# Patient Record
Sex: Male | Born: 1985 | Race: Black or African American | Hispanic: No | Marital: Single | Smoking: Current every day smoker
Health system: Southern US, Community
[De-identification: ages and names within clinical notes are randomized; demographics above are authoritative.]

---
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
## 2004-01-18 ENCOUNTER — Emergency Department (HOSPITAL_COMMUNITY): Admission: EM | Admit: 2004-01-18 | Discharge: 2004-01-18 | Payer: Self-pay | Admitting: Emergency Medicine

## 2007-03-05 ENCOUNTER — Ambulatory Visit (HOSPITAL_COMMUNITY): Admission: RE | Admit: 2007-03-05 | Discharge: 2007-03-05 | Payer: Self-pay | Admitting: Emergency Medicine

## 2007-03-05 ENCOUNTER — Emergency Department (HOSPITAL_COMMUNITY): Admission: EM | Admit: 2007-03-05 | Discharge: 2007-03-05 | Payer: Self-pay | Admitting: Emergency Medicine

## 2008-06-26 ENCOUNTER — Emergency Department (HOSPITAL_COMMUNITY): Admission: EM | Admit: 2008-06-26 | Discharge: 2008-06-26 | Payer: Self-pay | Admitting: Emergency Medicine

## 2010-01-15 ENCOUNTER — Emergency Department (HOSPITAL_COMMUNITY): Admission: EM | Admit: 2010-01-15 | Discharge: 2010-01-15 | Payer: Self-pay | Admitting: Emergency Medicine

## 2011-07-17 ENCOUNTER — Encounter (HOSPITAL_COMMUNITY): Payer: Self-pay | Admitting: *Deleted

## 2011-07-17 ENCOUNTER — Emergency Department (INDEPENDENT_AMBULATORY_CARE_PROVIDER_SITE_OTHER)
Admission: EM | Admit: 2011-07-17 | Discharge: 2011-07-17 | Disposition: A | Discharge status: Home / Self Care | Payer: Self-pay | Source: Home / Self Care

## 2011-07-17 DIAGNOSIS — J302 Other seasonal allergic rhinitis: Secondary | ICD-10-CM

## 2011-07-17 DIAGNOSIS — J309 Allergic rhinitis, unspecified: Secondary | ICD-10-CM

## 2011-07-17 DIAGNOSIS — K047 Periapical abscess without sinus: Secondary | ICD-10-CM

## 2011-07-17 MED ORDER — AMOXICILLIN 500 MG PO CAPS: 500.0000 mg | ORAL_CAPSULE | Freq: Three times a day (TID) | ORAL | Status: AC

## 2011-07-17 MED ORDER — HYDROCODONE-ACETAMINOPHEN 5-500 MG PO TABS: 1.0000 | ORAL_TABLET | Freq: Four times a day (QID) | ORAL | Status: AC | PRN

## 2011-07-17 MED ORDER — CETIRIZINE HCL 10 MG PO TABS: 10.0000 mg | ORAL_TABLET | Freq: Every day | ORAL | Status: AC

## 2011-07-17 NOTE — ED Provider Notes (Signed)
Medical screening examination/treatment/procedure(s) were performed by resident physician or non-physician practitioner and as supervising physician I was immediately available for consultation/collaboration.   Braylei Totino DOUGLAS MD.    Peg Fifer D Cornelio Parkerson, MD 07/17/11 2059 

## 2011-07-17 NOTE — ED Provider Notes (Signed)
Anthony Burnett is a 26 y.o. male who presents to Urgent Care today for   1) stuffy nose and sneezing for 3 days. Has tried Sudafed and Tylenol which helps only a little. Also notes some watery eyes and headache. He denies any  trouble breathing  2) tooth pain present for months worsened recently. Pain in the back lower right. Has a remnant of a tooth in bed in his jaw. Has been to a dentist and given a  Quote of $4-500 for extraction. No fevers or chills.   PMH reviewed. Otherwise healthy young man works at [employer] ROS as above otherwise neg.  no chest pains, palpitations, fevers, chills, abdominal pain nausea or vomiting. Medications reviewed. No current facility-administered medications for this encounter.   Current Outpatient Prescriptions  Medication Sig Dispense Refill  . amoxicillin (AMOXIL) 500 MG capsule Take 1 capsule (500 mg total) by mouth 3 (three) times daily.  42 capsule  0  . cetirizine (ZYRTEC ALLERGY) 10 MG tablet Take 1 tablet (10 mg total) by mouth daily.  30 tablet  1  . HYDROcodone-acetaminophen (VICODIN) 5-500 MG per tablet Take 1 tablet by mouth every 6 (six) hours as needed for pain.  30 tablet  0    Exam:  BP 139/82  Pulse 68  Temp(Src) 98.4 F (36.9 C) (Oral)  Resp 18  SpO2 98% Gen: Well NAD HEENT: EOMI,  MMM. Lower right jaw with hypertrophied gingiva growing over a exposed route of a tooth.  Mildly fluctuant to probe.  Tender to touch. Nasal turbinates are boggy and with clear nasal discharge.  No tenderness to percussion over the sinuses. Lungs: CTABL Nl WOB Heart: RRR no MRG Exts: Non edematous BL  LE, warm and well perfused.   Procedure note: Attempted to drain dental abscess.  Consent obtained and timeout performed.  An 18-gauge needle was used to poke the hypertrophied tissue around the tooth remnant.  Mild bleeding but no pus. Procedure terminated.  Mild bleeding which stopped after less than one minute.  Assessment and Plan: 26 y.o. male with    1) seasonal allergies: Plan for Zyrtec and followup as needed.   2) dental abscess.  Not able to be drained by myself and the clinic today.  Ultimately he will need a dental extraction, which is more complicated as it is just the tooth root that is left.  Will treat pain and abscess with amoxicillin and Vicodin.  Encouraged him to followup with a dentist as soon as possible.  He expresses understanding.      Rodolph Bong, MD 07/17/11 [phone]

## 2011-07-17 NOTE — Discharge Instructions (Signed)
Thank you for coming in today. That tooth needs to come out.  Please see a dentist as soon as possible. Take the pain medicine as needed. Take antibiotics 3 times a day for up to 2 weeks. I think you also have seasonal allergies. Take the Zyrtec daily.  Abscessed Tooth A tooth abscess is a collection of infected fluid (pus) from a bacterial infection in the inner part of the tooth (pulp). It usually occurs at the end of the tooth's root.  CAUSES   A very bad cavity (extensive tooth decay).   Trauma to the tooth, such as a broken or chipped tooth, that allows bacteria to enter into the pulp.  SYMPTOMS  Severe pain in and around the infected tooth.   Swelling and redness around the abscessed tooth or in the mouth or face.   Tenderness.   Pus drainage.   Bad breath.   Bitter taste in the mouth.   Difficulty swallowing.   Difficulty opening the mouth.   Feeling sick to your stomach (nauseous).   Vomiting.   Chills.   Swollen neck glands.  DIAGNOSIS  A medical and dental history will be taken.   An examination will be performed by tapping on the abscessed tooth.   X-rays may be taken of the tooth to identify the abscess.  TREATMENT The goal of treatment is to eliminate the infection.   You may be prescribed antibiotic medicine to stop the infection from spreading.   A root canal may be performed to save the tooth. If the tooth cannot be saved, it may be pulled (extracted) and the abscess may be drained.  HOME CARE INSTRUCTIONS  Only take over-the-counter or prescription medicines for pain, fever, or discomfort as directed by your caregiver.   Do not drive after taking pain medicine (narcotics).   Rinse your mouth (gargle) often with salt water ( tsp salt in 8 oz of warm water) to relieve pain or swelling.   Do not apply heat to the outside of your face.   Return to your dentist for further treatment as directed.  SEEK IMMEDIATE DENTAL CARE IF:  You have a  temperature by mouth above 102 F (38.9 C), not controlled by medicine.   You have chills or a very bad headache.   You have problems breathing or swallowing.   Your have trouble opening your mouth.   You develop swelling in the neck or around the eye.   Your pain is not helped by medicine.   Your pain is getting worse instead of better.  Document Released: 04/01/2005 Document Revised: 03/21/2011 Document Reviewed: 07/10/2010 [facility] Patient Information 2012 [place], [place].  Allergies, Generic Allergies may happen from anything your body is sensitive to. This may be food, medicines, pollens, chemicals, and nearly anything around you in everyday life that produces allergens. An allergen is anything that causes an allergy producing substance. Heredity is often a factor in causing these problems. This means you may have some of the same allergies as your parents. Food allergies happen in all age groups. Food allergies are some of the most severe and life threatening. Some common food allergies are cow's milk, seafood, eggs, nuts, wheat, and soybeans. SYMPTOMS   Swelling around the mouth.   An itchy red rash or hives.   Vomiting or diarrhea.   Difficulty breathing.  SEVERE ALLERGIC REACTIONS ARE LIFE-THREATENING. This reaction is called anaphylaxis. It can cause the mouth and throat to swell and cause difficulty with breathing and swallowing. In  severe reactions only a trace amount of food (for example, peanut oil in a salad) may cause death within seconds. Seasonal allergies occur in all age groups. These are seasonal because they usually occur during the same season every year. They may be a reaction to molds, grass pollens, or tree pollens. Other causes of problems are house dust mite allergens, pet dander, and mold spores. The symptoms often consist of nasal congestion, a runny itchy nose associated with sneezing, and tearing itchy eyes. There is often an associated itching of the  mouth and ears. The problems happen when you come in contact with pollens and other allergens. Allergens are the particles in the air that the body reacts to with an allergic reaction. This causes you to release allergic antibodies. Through a chain of events, these eventually cause you to release histamine into the blood stream. Although it is meant to be protective to the body, it is this release that causes your discomfort. This is why you were given anti-histamines to feel better. If you are unable to pinpoint the offending allergen, it may be determined by skin or blood testing. Allergies cannot be cured but can be controlled with medicine. Hay fever is a collection of all or some of the seasonal allergy problems. It may often be treated with simple over-the-counter medicine such as diphenhydramine. Take medicine as directed. Do not drink alcohol or drive while taking this medicine. Check with your caregiver or package insert for child dosages. If these medicines are not effective, there are many new medicines your caregiver can prescribe. Stronger medicine such as nasal spray, eye drops, and corticosteroids may be used if the first things you try do not work well. Other treatments such as immunotherapy or desensitizing injections can be used if all else fails. Follow up with your caregiver if problems continue. These seasonal allergies are usually not life threatening. They are generally more of a nuisance that can often be handled using medicine. HOME CARE INSTRUCTIONS   If unsure what causes a reaction, keep a diary of foods eaten and symptoms that follow. Avoid foods that cause reactions.   If hives or rash are present:   Take medicine as directed.   You may use an over-the-counter antihistamine (diphenhydramine) for hives and itching as needed.   Apply cold compresses (cloths) to the skin or take baths in cool water. Avoid hot baths or showers. Heat will make a rash and itching worse.   If  you are severely allergic:   Following a treatment for a severe reaction, hospitalization is often required for closer follow-up.   Wear a medic-alert bracelet or necklace stating the allergy.   You and your family must learn how to give adrenaline or use an anaphylaxis kit.   If you have had a severe reaction, always carry your anaphylaxis kit or EpiPen with you. Use this medicine as directed by your caregiver if a severe reaction is occurring. Failure to do so could have a fatal outcome.  SEEK MEDICAL CARE IF:  You suspect a food allergy. Symptoms generally happen within 30 minutes of eating a food.   Your symptoms have not gone away within 2 days or are getting worse.   You develop new symptoms.   You want to retest yourself or your child with a food or drink you think causes an allergic reaction. Never do this if an anaphylactic reaction to that food or drink has happened before. Only do this under the care of a caregiver.  SEEK IMMEDIATE MEDICAL CARE IF:   You have difficulty breathing, are wheezing, or have a tight feeling in your chest or throat.   You have a swollen mouth, or you have hives, swelling, or itching all over your body.   You have had a severe reaction that has responded to your anaphylaxis kit or an EpiPen. These reactions may return when the medicine has worn off. These reactions should be considered life threatening.  MAKE SURE YOU:   Understand these instructions.   Will watch your condition.   Will get help right away if you are not doing well or get worse.  Document Released: 06/25/2002 Document Revised: 03/21/2011 Document Reviewed: 11/30/2007 [facility] Patient Information 2012 [place], [place].

## 2011-07-17 NOTE — ED Notes (Signed)
Pt  Reports  Symptoms  Of     Sinus  Congestion     Headache  Stuffy  Nose  And       Cough          X  1  Week            He   Is     Awake    And  Alert

## 2013-02-09 ENCOUNTER — Emergency Department (INDEPENDENT_AMBULATORY_CARE_PROVIDER_SITE_OTHER)
Admission: EM | Admit: 2013-02-09 | Discharge: 2013-02-09 | Disposition: A | Discharge status: Home / Self Care | Payer: Self-pay | Source: Home / Self Care

## 2013-02-09 ENCOUNTER — Encounter (HOSPITAL_COMMUNITY): Payer: Self-pay | Admitting: Emergency Medicine

## 2013-02-09 DIAGNOSIS — K029 Dental caries, unspecified: Secondary | ICD-10-CM

## 2013-02-09 MED ORDER — CLINDAMYCIN HCL 300 MG PO CAPS: 300.0000 mg | ORAL_CAPSULE | Freq: Three times a day (TID) | ORAL | Status: DC

## 2013-02-09 MED ORDER — TRAMADOL HCL 50 MG PO TABS: 50.0000 mg | ORAL_TABLET | Freq: Four times a day (QID) | ORAL | Status: DC | PRN

## 2013-02-09 NOTE — ED Provider Notes (Signed)
CSN: [id number]     Arrival date & time 02/09/13  1424 History   None    Chief Complaint  Patient presents with  . Dental Pain   (Consider location/radiation/quality/duration/timing/severity/associated sxs/prior Treatment) Patient is a 27 y.o. male presenting with tooth pain. The history is provided by the patient.  Dental Pain Location:  Lower Lower teeth location:  32/RL 3rd molar and 31/RL 2nd molar Quality:  Throbbing Severity:  Moderate Onset quality:  Sudden Duration:  1 day Progression:  Worsening Chronicity:  New Context: abscess and poor dentition   Associated symptoms: no facial pain, no facial swelling, no fever, no neck pain and no trismus   Risk factors: periodontal disease   Risk factors: sufficient dental care     History reviewed. No pertinent past medical history. History reviewed. No pertinent past surgical history. History reviewed. No pertinent family history. History  Substance Use Topics  . Smoking status: Current Every Day Smoker  . Smokeless tobacco: Not on file  . Alcohol Use: Yes    Review of Systems  Constitutional: Negative for fever.  HENT: Positive for dental problem. Negative for facial swelling.   Musculoskeletal: Negative for neck pain.    Allergies  Review of patient's allergies indicates no known allergies.  Home Medications   Current Outpatient Rx  Name  Route  Sig  Dispense  Refill  . Acetaminophen (TYLENOL PO)   Oral   Take by mouth.         . EXPIRED: cetirizine (ZYRTEC ALLERGY) 10 MG tablet   Oral   Take 1 tablet (10 mg total) by mouth daily.   30 tablet   1   . clindamycin (CLEOCIN) 300 MG capsule   Oral   Take 1 capsule (300 mg total) by mouth 3 (three) times daily.   28 capsule   0   . traMADol (ULTRAM) 50 MG tablet   Oral   Take 1 tablet (50 mg total) by mouth every 6 (six) hours as needed for pain.   15 tablet   0    BP 105/66  Pulse 71  Temp(Src) 98 F (36.7 C) (Oral)  Resp 22  SpO2  100% Physical Exam  Nursing note and vitals reviewed. Constitutional: He is oriented to person, place, and time. He appears well-developed and well-nourished.  HENT:  Head: Normocephalic.  Right Ear: External ear normal.  Left Ear: External ear normal.  Mouth/Throat: Oropharynx is clear and moist. Abnormal dentition. Dental abscesses and dental caries present.    Neck: Normal range of motion. Neck supple.  Lymphadenopathy:    He has no cervical adenopathy.  Neurological: He is alert and oriented to person, place, and time.  Skin: Skin is warm and dry.    ED Course  Procedures (including critical care time) Labs Review Labs Reviewed - No data to display Imaging Review No results found.    MDM      Linna Hoff, MD 02/09/13 [phone]

## 2013-02-09 NOTE — ED Notes (Signed)
Pt assessed for c/o SOB.  Pt reports oral abscess (proximal to rt lower molar) that is draining and causing pn in rt face, and disturbing vision in rt eye. Vitals obtained (WNL), and pt asked to be seated in waiting area pending triage by RN.

## 2013-02-09 NOTE — ED Notes (Signed)
Pt  Has  Toothache     r  Lower  Side       Of  Mouth      -          He    Reports  Headache  As  Well  -  Pt  Reports         That   He  Spoke  To  A  Dentist  Today  And  Was  Advised  To  [facility]  [facility] and  Be  Given a  referell

## 2014-07-27 ENCOUNTER — Emergency Department (INDEPENDENT_AMBULATORY_CARE_PROVIDER_SITE_OTHER)
Admission: EM | Admit: 2014-07-27 | Discharge: 2014-07-27 | Disposition: A | Discharge status: Home / Self Care | Payer: Self-pay | Source: Home / Self Care | Attending: Emergency Medicine | Admitting: Emergency Medicine

## 2014-07-27 ENCOUNTER — Encounter (HOSPITAL_COMMUNITY): Payer: Self-pay

## 2014-07-27 DIAGNOSIS — J301 Allergic rhinitis due to pollen: Secondary | ICD-10-CM

## 2014-07-27 DIAGNOSIS — J309 Allergic rhinitis, unspecified: Secondary | ICD-10-CM

## 2014-07-27 MED ORDER — TRIAMCINOLONE ACETONIDE 40 MG/ML IJ SUSP
60.0000 mg | Freq: Once | INTRAMUSCULAR | Status: AC
  Administered 2014-07-27: 60 mg via INTRAMUSCULAR

## 2014-07-27 MED ORDER — TRIAMCINOLONE ACETONIDE 40 MG/ML IJ SUSP
INTRAMUSCULAR | Status: AC
  Filled 2014-07-27: qty 1

## 2014-07-27 MED ORDER — PREDNISONE 20 MG PO TABS: ORAL_TABLET | ORAL | Status: DC

## 2014-07-27 NOTE — Discharge Instructions (Signed)

## 2014-07-27 NOTE — ED Provider Notes (Signed)
CSN: [id number]     Arrival date & time 07/27/14  [id number] History   First MD Initiated Contact with Patient 07/27/14 [phone]     Chief Complaint  Patient presents with  . Facial Pain   (Consider location/radiation/quality/duration/timing/severity/associated sxs/prior Treatment) HPI Comments: 29 year old male complaining of congestion in the face and for head for [place] one month. It is associated with runny nose followed by dry nose frequently, soreness in the 4 head. He has been using Advil, nasal saline and Flonase. He denies PND Denies fevers.   History reviewed. No pertinent past medical history. History reviewed. No pertinent past surgical history. History reviewed. No pertinent family history. History  Substance Use Topics  . Smoking status: Current Every Day Smoker  . Smokeless tobacco: Not on file  . Alcohol Use: Yes    Review of Systems  Constitutional: Negative for fever, activity change and fatigue.  HENT: Positive for congestion, rhinorrhea and sinus pressure. Negative for ear pain, postnasal drip and sore throat.   Eyes: Negative.   Respiratory: Negative for cough and shortness of breath.   Cardiovascular: Negative.   Gastrointestinal: Negative.     Allergies  Review of patient's allergies indicates no known allergies.  Home Medications   Prior to Admission medications   Medication Sig Start Date End Date Taking? Authorizing Provider  Acetaminophen (TYLENOL PO) Take by mouth.    Historical Provider, MD  cetirizine (ZYRTEC ALLERGY) 10 MG tablet Take 1 tablet (10 mg total) by mouth daily. 07/17/11 07/16/12  Rodolph BongEvan S Corey, MD  predniSONE (DELTASONE) 20 MG tablet Take 3 tabs po on first day, 2 tabs second day, 2 tabs third day, 1 tab fourth day, 1 tab 5th day. Take with food. Start 07/27/2014. 07/27/14   Hayden Rasmussenavid Loren Sawaya, NP   BP 116/68 mmHg  Pulse 67  Temp(Src) 98 F (36.7 C) (Oral)  Resp 12  SpO2 99% Physical Exam  Constitutional: He is oriented to person, place, and time. He  appears well-developed and well-nourished. No distress.  HENT:  Mouth/Throat: No oropharyngeal exudate.  Bilateral TMs are normal Oropharynx with minor erythema, moderate clear PND. Some maxillary and 4 head tenderness.  Eyes: Conjunctivae and EOM are normal.  Neck: Normal range of motion. Neck supple.  Cardiovascular: Normal rate, regular rhythm and normal heart sounds.   Pulmonary/Chest: Effort normal and breath sounds normal. No respiratory distress. He has no wheezes. He has no rales.  Lymphadenopathy:    He has no cervical adenopathy.  Neurological: He is alert and oriented to person, place, and time.  Skin: Skin is warm and dry.  Psychiatric: He has a normal mood and affect.  Nursing note and vitals reviewed.   ED Course  Procedures (including critical care time) Labs Review Labs Reviewed - No data to display  Imaging Review No results found.   MDM   1. Allergic rhinitis due to pollen   2. Allergic sinusitis    Use your choice of Zyrtec, Claritin or Allegra for drainage and allergies. Sudafed PE 10 mg every 4 hours for congestion Frequently use your saline nasal spray and netty pot as needed  Kenalog 60 mg IM Tomorrow start an is on taper Drink plenty of fluids stay well hydrated and follow above instructions.    Hayden Rasmussenavid Jodeen Mclin, NP 07/27/14 [phone]

## 2014-07-27 NOTE — ED Notes (Signed)
C/o pain in forehead, cheeks x 1 month. Yellow nasal secretions

## 2014-12-06 ENCOUNTER — Emergency Department (HOSPITAL_COMMUNITY)
Admission: EM | Admit: 2014-12-06 | Discharge: 2014-12-06 | Disposition: A | Discharge status: Home / Self Care | Payer: No Typology Code available for payment source | Attending: Emergency Medicine | Admitting: Emergency Medicine

## 2014-12-06 ENCOUNTER — Encounter (HOSPITAL_COMMUNITY): Payer: Self-pay | Admitting: Nurse Practitioner

## 2014-12-06 DIAGNOSIS — Z720 Tobacco use: Secondary | ICD-10-CM | POA: Insufficient documentation

## 2014-12-06 DIAGNOSIS — Z7952 Long term (current) use of systemic steroids: Secondary | ICD-10-CM | POA: Diagnosis not present

## 2014-12-06 DIAGNOSIS — Z79899 Other long term (current) drug therapy: Secondary | ICD-10-CM | POA: Insufficient documentation

## 2014-12-06 DIAGNOSIS — S4991XA Unspecified injury of right shoulder and upper arm, initial encounter: Secondary | ICD-10-CM | POA: Insufficient documentation

## 2014-12-06 DIAGNOSIS — S80212A Abrasion, left knee, initial encounter: Secondary | ICD-10-CM | POA: Insufficient documentation

## 2014-12-06 DIAGNOSIS — M25562 Pain in left knee: Secondary | ICD-10-CM

## 2014-12-06 DIAGNOSIS — Y9389 Activity, other specified: Secondary | ICD-10-CM | POA: Diagnosis not present

## 2014-12-06 DIAGNOSIS — Y999 Unspecified external cause status: Secondary | ICD-10-CM | POA: Diagnosis not present

## 2014-12-06 DIAGNOSIS — M7918 Myalgia, other site: Secondary | ICD-10-CM

## 2014-12-06 DIAGNOSIS — S8992XA Unspecified injury of left lower leg, initial encounter: Secondary | ICD-10-CM | POA: Diagnosis present

## 2014-12-06 DIAGNOSIS — Y92410 Unspecified street and highway as the place of occurrence of the external cause: Secondary | ICD-10-CM | POA: Insufficient documentation

## 2014-12-06 MED ORDER — ACETAMINOPHEN 325 MG PO TABS
650.0000 mg | ORAL_TABLET | Freq: Once | ORAL | Status: AC
  Administered 2014-12-06: 650 mg via ORAL
  Filled 2014-12-06: qty 2

## 2014-12-06 MED ORDER — NAPROXEN 250 MG PO TABS: 250.0000 mg | ORAL_TABLET | Freq: Two times a day (BID) | ORAL | Status: DC

## 2014-12-06 MED ORDER — METHOCARBAMOL 500 MG PO TABS: 500.0000 mg | ORAL_TABLET | Freq: Two times a day (BID) | ORAL | Status: DC | PRN
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
## 2014-12-07 ENCOUNTER — Emergency Department (HOSPITAL_COMMUNITY): Payer: No Typology Code available for payment source

## 2014-12-07 ENCOUNTER — Encounter (HOSPITAL_COMMUNITY): Payer: Self-pay | Admitting: *Deleted

## 2014-12-07 ENCOUNTER — Emergency Department (HOSPITAL_COMMUNITY)
Admission: EM | Admit: 2014-12-07 | Discharge: 2014-12-07 | Disposition: A | Discharge status: Home / Self Care | Payer: No Typology Code available for payment source | Attending: Emergency Medicine | Admitting: Emergency Medicine

## 2014-12-07 DIAGNOSIS — Y92410 Unspecified street and highway as the place of occurrence of the external cause: Secondary | ICD-10-CM | POA: Insufficient documentation

## 2014-12-07 DIAGNOSIS — S199XXA Unspecified injury of neck, initial encounter: Secondary | ICD-10-CM | POA: Insufficient documentation

## 2014-12-07 DIAGNOSIS — Z79899 Other long term (current) drug therapy: Secondary | ICD-10-CM | POA: Diagnosis not present

## 2014-12-07 DIAGNOSIS — Z720 Tobacco use: Secondary | ICD-10-CM | POA: Diagnosis not present

## 2014-12-07 DIAGNOSIS — Y998 Other external cause status: Secondary | ICD-10-CM | POA: Diagnosis not present

## 2014-12-07 DIAGNOSIS — S161XXA Strain of muscle, fascia and tendon at neck level, initial encounter: Secondary | ICD-10-CM

## 2014-12-07 DIAGNOSIS — S39012A Strain of muscle, fascia and tendon of lower back, initial encounter: Secondary | ICD-10-CM

## 2014-12-07 DIAGNOSIS — H43393 Other vitreous opacities, bilateral: Secondary | ICD-10-CM | POA: Diagnosis not present

## 2014-12-07 DIAGNOSIS — Y9389 Activity, other specified: Secondary | ICD-10-CM | POA: Diagnosis not present

## 2014-12-07 DIAGNOSIS — S80212A Abrasion, left knee, initial encounter: Secondary | ICD-10-CM | POA: Insufficient documentation

## 2014-12-07 DIAGNOSIS — S80211A Abrasion, right knee, initial encounter: Secondary | ICD-10-CM | POA: Diagnosis not present

## 2014-12-07 DIAGNOSIS — M542 Cervicalgia: Secondary | ICD-10-CM

## 2014-12-07 MED ORDER — METHOCARBAMOL 500 MG PO TABS
500.0000 mg | ORAL_TABLET | Freq: Once | ORAL | Status: AC
Start: 2014-12-07 — End: 2014-12-07
  Administered 2014-12-07: 500 mg via ORAL
  Filled 2014-12-07: qty 1

## 2014-12-07 MED ORDER — NAPROXEN 250 MG PO TABS
500.0000 mg | ORAL_TABLET | Freq: Once | ORAL | Status: AC
Start: 2014-12-07 — End: 2014-12-07
  Administered 2014-12-07: 500 mg via ORAL
  Filled 2014-12-07: qty 2

## 2014-12-07 NOTE — Discharge Instructions (Signed)
1. Medications: robaxin, naproxyn, (both prescribed yesterday) usual home medications 2. Treatment: rest, drink plenty of fluids, gentle stretching as discussed, alternate ice and heat 3. Follow Up: Please followup with your primary doctor in 3 days for discussion of your diagnoses and further evaluation after today's visit; if you do not have a primary care doctor use the resource guide provided to find one;  Return to the ER for worsening back pain, difficulty walking, loss of bowel or bladder control or other concerning symptoms  Back Exercises Back exercises help treat and prevent back injuries. The goal of back exercises is to increase the strength of your abdominal and back muscles and the flexibility of your back. These exercises should be started when you no longer have back pain. Back exercises include:  Pelvic Tilt. Lie on your back with your knees bent. Tilt your pelvis until the lower part of your back is against the floor. Hold this position 5 to 10 sec and repeat 5 to 10 times.  Knee to Chest. Pull first 1 knee up against your chest and hold for 20 to 30 seconds, repeat this with the other knee, and then both knees. This may be done with the other leg straight or bent, whichever feels better.  Sit-Ups or Curl-Ups. Bend your knees 90 degrees. Start with tilting your pelvis, and do a partial, slow sit-up, lifting your trunk only 30 to 45 degrees off the floor. Take at least 2 to 3 seconds for each sit-up. Do not do sit-ups with your knees out straight. If partial sit-ups are difficult, simply do the above but with only tightening your abdominal muscles and holding it as directed.  Hip-Lift. Lie on your back with your knees flexed 90 degrees. Push down with your feet and shoulders as you raise your hips a couple inches off the floor; hold for 10 seconds, repeat 5 to 10 times.  Back arches. Lie on your stomach, propping yourself up on bent elbows. Slowly press on your hands, causing an arch  in your low back. Repeat 3 to 5 times. Any initial stiffness and discomfort should lessen with repetition over time.  Shoulder-Lifts. Lie face down with arms beside your body. Keep hips and torso pressed to floor as you slowly lift your head and shoulders off the floor. Do not overdo your exercises, especially in the beginning. Exercises may cause you some mild back discomfort which lasts for a few minutes; however, if the pain is more severe, or lasts for more than 15 minutes, do not continue exercises until you see your caregiver. Improvement with exercise therapy for back problems is slow.  See your caregivers for assistance with developing a proper back exercise program. Document Released: 05/09/2004 Document Revised: 06/24/2011 Document Reviewed: 01/31/2011 [facility] Patient Information 2015 [place], [place]. This information is not intended to replace advice given to you by your health care provider. Make sure you discuss any questions you have with your health care provider.     Emergency Department Resource Guide 1) Find a Doctor and Pay Out of Pocket Although you won't have to find out who is covered by your insurance plan, it is a good idea to ask around and get recommendations. You will then need to call the office and see if the doctor you have chosen will accept you as a new patient and what types of options they offer for patients who are self-pay. Some doctors offer discounts or will set up payment plans for their patients who do not  have insurance, but you will need to ask so you aren't surprised when you get to your appointment.  2) Contact Your Local Health Department Not all health departments have doctors that can see patients for sick visits, but many do, so it is worth a call to see if yours does. If you don't know where your local health department is, you can check in your phone book. The CDC also has a tool to help you locate your state's health department, and many state  websites also have listings of all of their local health departments.  3) Find a Walk-in Clinic If your illness is not likely to be very severe or complicated, you may want to try a walk in clinic. These are popping up all over the country in pharmacies, drugstores, and shopping centers. They're usually staffed by nurse practitioners or physician assistants that have been trained to treat common illnesses and complaints. They're usually fairly quick and inexpensive. However, if you have serious medical issues or chronic medical problems, these are probably not your best option.  No Primary Care Doctor: - Call Health Connect at  [phone] - they can help you locate a primary care doctor that  accepts your insurance, provides certain services, etc. - Physician Referral Service- [phone]  Chronic Pain Problems: Organization         Address  Phone   Notes  [facility]  [phone] Patients need to be referred by their primary care doctor.   Medication Assistance: Organization         Address  Phone   Notes  [facility] Medication [facility] [address] [place] [place], [place] [address] [phone] --Must be a resident of [facility] -- Must have NO insurance coverage whatsoever (no Medicaid/ Medicare, etc.) -- The pt. MUST have a primary care doctor that directs their care regularly and follows them in the community   [facility]  [phone]   [employer]  [phone]    Agencies that provide inexpensive medical care: Organization         Address  Phone   Notes  [facility]  [phone]   [facility]    [phone]   [facility] [address] [place], [place] [address] [phone]   [facility] [address], [place] [phone]   Planned Parenthood    [phone]   [facility]    [phone]   Community Health and [facility]  [address], [place] Phone:  [phone], Fax:  [phone] Hours of Operation:  9 am - 6 pm, M-F.  Also accepts Medicaid/Medicare and self-pay.  [facility]  [address], [place] Phone: [phone], Fax: [phone]. Hours of Operation:  8:30 am - 5:30 pm, M-F.  Also accepts Medicaid and self-pay.  [facility] [place] [address], [place] Phone: [phone]   [facility] [address] Natasha Bence [place], [place] [phone], Ext. [phone] Mondays & Thursdays: 7-9 AM.  First 15 patients are seen on a first come, first serve basis.    Medicaid-accepting [facility] Providers:  Organization         Address  Phone   Notes  [facility] [address], [place] [phone] Also accepts self-pay patients.  [facility] [address]  [address] Laurell Josephs [place], [place]  [phone]   [facility] [address], [place] [phone]   [facility] [address] [place] [phone]   Renaye Rakers [address], [place]   [phone] Only accepts [place] Access [place] patients after they have their name applied to their card.   Self-Pay (no insurance) in [facility]:  Organization         Address  Phone   Notes  Sickle Cell Patients, [facility] [address] [place], [place] [phone]   [facility]gent Care [address] [place], [place] [phone]   [facility] [place]  [address], [place] [phone]   [facility]/Dr. Osei-Bonsu  [address] [place] or [address], [place] [phone] Phone number for both [place] and [place] locations is the same.  [facility]gent [facility] and [facility] [address] [place] [phone]   [facility] [address], [place] or [address] [phone]   [facility] [address] [place] [phone], phone; [phone], fax Sees patients 1st and 3rd Saturday of every month.  Must not qualify for public or private insurance (i.e. Medicaid, Medicare, [place] Health Choice, Veterans' Benefits)  Household income should be no more than 200% of the poverty level The clinic cannot treat you if you are pregnant or think you are pregnant  Sexually transmitted diseases are not treated at the clinic.    Dental Care: Organization         Address  Phone  Notes  [facility] Department of [facility] [address] [place], [place] [phone] Accepts children up to age 41 who are enrolled in [place] or [place] Health Choice; pregnant women with a Medicaid card; and children who have applied for Medicaid or [place] Health Choice, but were declined, whose parents can pay a reduced fee at time of service.  [facility] Department of [facility]  [address], [place] [phone] Accepts children up to age 62 who are enrolled in [place] or  Health Choice; pregnant women with a Medicaid card; and children who have applied for Medicaid or  Health Choice, but were declined, whose parents can pay a reduced fee at time of service.  [facility]  [address] [place], [place] [phone] Patients are seen by appointment only. Walk-ins are not accepted. [facility] will see patients 32 years of age and older. Monday - Tuesday (8am-5pm) Most Wednesdays (8:30-5pm) $30 per visit, cash only  [facility] Adult Dental Access PROGRAM  [address], [facility] [phone] Patients are seen by appointment only. Walk-ins are not accepted. [facility] will see patients 86 years of age and older. One Wednesday Evening (Monthly: Volunteer Based).  $30 per visit, cash only  [employer]  [phone] for adults; Children under age 58, call Graduate Pediatric Dentistry at [phone]. Children aged 42-14, please call [phone] to request a pediatric application.  Dental services are provided in all areas of dental care including fillings, crowns and bridges, complete and partial dentures, implants, gum treatment, root canals, and extractions. Preventive care is also provided. Treatment is provided to both adults and children. Patients are selected  via a lottery and there is often a waiting list.   [facility] [address], [place]  [phone] [email or web address]   [facility] [address] [place], [place] [phone], Ext. [phone] Second and Fourth Thursday of each month, opens at 6:30 AM; Clinic ends at 9 AM.  Patients are seen on a first-come first-served basis, and a limited number are seen during each clinic.   [facility]  [address] Ether Griffins [place], [place] [phone]   Eligibility Requirements You must have lived in [place], [place], or [place] counties for at least the last three months.   You cannot be eligible for state or federal sponsored [place], including [employer], [place], or [employer].   You generally cannot be eligible for healthcare insurance through your employer.    How to apply: Eligibility screenings are held every Tuesday and Wednesday afternoon from 1:00 pm until 4:00 pm. You do not need an appointment for the interview!  [facility] [address], [place], [place] [phone]   [facility]  [phone]   [facility]  [phone]   [facility] Health Department  [phone]    Behavioral Health Resources in the Community: Intensive Outpatient Programs Organization         Address  Phone  Notes  [facility] [address] [place], [place]  [phone]   [facility] Outpatient [address], [place], [place] [phone]   [facility] [address], [place], [place]  [phone]   [facility] Mental Health [address],  [place], [place] [phone] or [phone]   Substance Abuse Resources Organization         Address  Phone  Notes  [facility]  [phone]   [facility]  [phone]   [facility]  [phone]   Floydene Flock  [phone]   Residential & Outpatient Substance Abuse Program  [phone]   Psychological Services Organization         Address  Phone  Notes  [facility] Behavioral Health  [phone]   [facility] Services  [phone]   [facility] Mental Health [address] [place] [phone] or [phone]    Mobile Crisis Teams Organization         Address  Phone  Notes  [facility], Mobile Crisis Care Unit  [phone]   [facility]  [address] [place], [place] [phone]   Doristine Locks [address] [place] [phone]    Self-Help/Support Groups Organization         Address  Phone             Notes  [facility] [place] - variety of support groups  336- I7437963 Call for more information  Narcotics Anonymous (NA), [facility] [address], [employer] [place]  2 meetings at this location   Statistician         Address  Phone  Notes  [facility] [address],    [place]  [phone]   [facility]  [address] [place] [address], [place], [place] [phone]   [facility] [address] [place], [place] [phone] Admissions: 8am-3pm M-F  [facility] [address],    [place], [place] [phone]   [facility] [address]  Christella [place], [place] [phone]   [facility] [address],    [place], [place] [phone]   [facility] - Intensive Outpatient [address] Laurell Josephs [address], [place], [place] [phone]   [facility] ([facility] [address]  [place], [place] [phone] or [phone]   [facility] ([facility]) [address] [place], [place] [phone] Accepts Medicaid  [facility] [address],  [place] [phone] Substance Abuse/Addiction Treatment   [facility] Organization         Address  Phone  Notes  [facility]  [phone]   Angie Fava, PhD [address] Ervin Knack [place], [place]   [phone] or [phone]   [facility]al   [address] [place], [place] [phone]   [facility] [address], [place], [place] [phone] Insurance/Medicaid/sponsorship through [facility] [address]                                    [place], [place] [phone] Therapy/tele-psych/case  [facility] [address], [place] [phone]    Dr. Lolly Mustache  [phone]   [facility]  [employer] [facility] Dept. 1) [address] [place] 2) [address] [place] 3)  [address], [place] [phone]  [phone]   [facility] Child Abuse Hotline (515) 112-4531 or [phone] (After Hours)
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
## 2014-12-07 NOTE — ED Notes (Signed)
PT placed in gown and in bed. Pt monitored by pulse ox, bp cuff, and 5-lead. 
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
## 2015-01-16 ENCOUNTER — Emergency Department (HOSPITAL_COMMUNITY)
Admission: EM | Admit: 2015-01-16 | Discharge: 2015-01-17 | Disposition: A | Discharge status: Home / Self Care | Payer: Self-pay | Attending: Emergency Medicine | Admitting: Emergency Medicine

## 2015-01-16 ENCOUNTER — Encounter (HOSPITAL_COMMUNITY): Payer: Self-pay | Admitting: Emergency Medicine

## 2015-01-16 DIAGNOSIS — Z87828 Personal history of other (healed) physical injury and trauma: Secondary | ICD-10-CM | POA: Insufficient documentation

## 2015-01-16 DIAGNOSIS — Z720 Tobacco use: Secondary | ICD-10-CM | POA: Insufficient documentation

## 2015-01-16 DIAGNOSIS — R079 Chest pain, unspecified: Secondary | ICD-10-CM | POA: Insufficient documentation

## 2015-01-16 DIAGNOSIS — J3489 Other specified disorders of nose and nasal sinuses: Secondary | ICD-10-CM | POA: Insufficient documentation

## 2015-01-16 DIAGNOSIS — Z79899 Other long term (current) drug therapy: Secondary | ICD-10-CM | POA: Insufficient documentation

## 2015-01-16 LAB — CBC
HCT: 41.2 % (ref 39.0–52.0)
Hemoglobin: 14.5 g/dL (ref 13.0–17.0)
MCH: 31.7 pg (ref 26.0–34.0)
MCHC: 35.2 g/dL (ref 30.0–36.0)
MCV: 90.2 fL (ref 78.0–100.0)
Platelets: 263 10*3/uL (ref 150–400)
RBC: 4.57 MIL/uL (ref 4.22–5.81)
RDW: 13.1 % (ref 11.5–15.5)
WBC: 7.1 10*3/uL (ref 4.0–10.5)

## 2015-01-16 LAB — COMPREHENSIVE METABOLIC PANEL WITH GFR
ALT: 27 U/L (ref 17–63)
AST: 27 U/L (ref 15–41)
Albumin: 4.3 g/dL (ref 3.5–5.0)
Alkaline Phosphatase: 39 U/L (ref 38–126)
Anion gap: 11 (ref 5–15)
BUN: 9 mg/dL (ref 6–20)
CO2: 24 mmol/L (ref 22–32)
Calcium: 9.8 mg/dL (ref 8.9–10.3)
Chloride: 102 mmol/L (ref 101–111)
Creatinine, Ser: 1.08 mg/dL (ref 0.61–1.24)
GFR calc Af Amer: >60 mL/min
GFR calc non Af Amer: >60 mL/min
Glucose, Bld: 95 mg/dL (ref 65–99)
Potassium: 3.6 mmol/L (ref 3.5–5.1)
Sodium: 137 mmol/L (ref 135–145)
Total Bilirubin: 0.7 mg/dL (ref 0.3–1.2)
Total Protein: 7.5 g/dL (ref 6.5–8.1)

## 2015-01-16 LAB — URINALYSIS, ROUTINE W REFLEX MICROSCOPIC
Bilirubin Urine: NEGATIVE
Glucose, UA: NEGATIVE mg/dL
Ketones, ur: NEGATIVE mg/dL
Leukocytes, UA: NEGATIVE
Nitrite: NEGATIVE
Protein, ur: NEGATIVE mg/dL
Specific Gravity, Urine: 1.007 (ref 1.005–1.030)
Urobilinogen, UA: 0.2 mg/dL (ref 0.0–1.0)
pH: 6.5 (ref 5.0–8.0)

## 2015-01-16 LAB — URINE MICROSCOPIC-ADD ON

## 2015-01-16 MED ORDER — OXYCODONE-ACETAMINOPHEN 5-325 MG PO TABS
ORAL_TABLET | ORAL | Status: AC
  Filled 2015-01-16: qty 1

## 2015-01-16 MED ORDER — HYDROMORPHONE HCL 1 MG/ML IJ SOLN
1.0000 mg | Freq: Once | INTRAMUSCULAR | Status: AC
  Administered 2015-01-17: 1 mg via INTRAVENOUS
  Filled 2015-01-16: qty 1

## 2015-01-16 MED ORDER — OXYCODONE-ACETAMINOPHEN 5-325 MG PO TABS
1.0000 | ORAL_TABLET | Freq: Once | ORAL | Status: AC
  Administered 2015-01-16: 1 via ORAL

## 2015-01-16 MED ORDER — SODIUM CHLORIDE 0.9 % IV BOLUS (SEPSIS)
1000.0000 mL | Freq: Once | INTRAVENOUS | Status: AC
  Administered 2015-01-17: 1000 mL via INTRAVENOUS
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
## 2015-01-17 ENCOUNTER — Emergency Department (HOSPITAL_COMMUNITY): Payer: Self-pay

## 2015-01-17 MED ORDER — IOHEXOL 300 MG/ML  SOLN
50.0000 mL | Freq: Once | INTRAMUSCULAR | Status: AC | PRN
  Administered 2015-01-17: 50 mL via INTRAVENOUS

## 2015-01-17 MED ORDER — AMOXICILLIN-POT CLAVULANATE 875-125 MG PO TABS
1.0000 | ORAL_TABLET | Freq: Once | ORAL | Status: AC
  Administered 2015-01-17: 1 via ORAL
  Filled 2015-01-17: qty 1

## 2015-01-17 MED ORDER — IOHEXOL 300 MG/ML  SOLN
100.0000 mL | Freq: Once | INTRAMUSCULAR | Status: AC | PRN
  Administered 2015-01-17: 100 mL via INTRAVENOUS

## 2015-01-17 MED ORDER — ACETAMINOPHEN 500 MG PO TABS
1000.0000 mg | ORAL_TABLET | Freq: Once | ORAL | Status: AC
  Administered 2015-01-17: 1000 mg via ORAL
  Filled 2015-01-17: qty 2

## 2015-01-17 MED ORDER — AMOXICILLIN-POT CLAVULANATE 875-125 MG PO TABS: 1.0000 | ORAL_TABLET | Freq: Two times a day (BID) | ORAL | Status: AC

## 2015-01-17 MED ORDER — DIPHENHYDRAMINE HCL 50 MG/ML IJ SOLN
50.0000 mg | Freq: Once | INTRAMUSCULAR | Status: AC
  Administered 2015-01-17: 50 mg via INTRAVENOUS
  Filled 2015-01-17: qty 1

## 2015-01-17 MED ORDER — HYDROCODONE-ACETAMINOPHEN 5-325 MG PO TABS: 2.0000 | ORAL_TABLET | ORAL | Status: AC | PRN

## 2015-01-17 MED ORDER — METOCLOPRAMIDE HCL 5 MG/ML IJ SOLN
10.0000 mg | Freq: Once | INTRAMUSCULAR | Status: AC
  Administered 2015-01-17: 10 mg via INTRAVENOUS
  Filled 2015-01-17: qty 2

## 2015-01-17 NOTE — ED Notes (Signed)
Patient in CT at this time.

## 2015-01-17 NOTE — Discharge Instructions (Signed)
Mr. Anthony Burnett, your CT scan today were normal.  See ENT within 3 days for close follow up.  Take antibiotics to see if they help.  If symptoms worsen, come back to the ED immediately.  Thank you.

## 2015-01-17 NOTE — ED Notes (Signed)
Discharge instructions/prescriptions reviewed with patient. Understanding verbalized. Patient declined wheelchair at time of discharge. No acute distress noted. 

## 2015-07-15 DEATH — deceased
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
